# Patient Record
Sex: Male | Born: 1980 | Hispanic: Yes | Marital: Single | Smoking: Never smoker
Health system: Southern US, Community
[De-identification: ages and names within clinical notes are randomized; demographics above are authoritative.]

---

## 2015-03-26 ENCOUNTER — Ambulatory Visit (INDEPENDENT_AMBULATORY_CARE_PROVIDER_SITE_OTHER): Payer: 59 | Admitting: Family Medicine

## 2015-03-26 VITALS — BP 130/80 | HR 62 | Temp 97.4°F | Ht 65.5 in | Wt 173.1 lb

## 2015-03-26 DIAGNOSIS — R079 Chest pain, unspecified: Secondary | ICD-10-CM | POA: Diagnosis not present

## 2015-03-26 DIAGNOSIS — I450 Right fascicular block: Secondary | ICD-10-CM | POA: Diagnosis not present

## 2015-03-26 DIAGNOSIS — H538 Other visual disturbances: Secondary | ICD-10-CM | POA: Diagnosis not present

## 2015-03-26 DIAGNOSIS — R0789 Other chest pain: Secondary | ICD-10-CM | POA: Diagnosis not present

## 2015-03-26 DIAGNOSIS — I4510 Unspecified right bundle-branch block: Secondary | ICD-10-CM

## 2015-03-26 LAB — LIPID PANEL
CHOL/HDL RATIO: 3.9 ratio
Cholesterol: 140 mg/dL (ref 0–200)
HDL: 36 mg/dL — AB (ref >=40)
LDL Cholesterol: 82 mg/dL (ref 0–99)
Triglycerides: 111 mg/dL (ref <150)
VLDL: 22 mg/dL (ref 0–40)

## 2015-03-26 LAB — POCT GLYCOSYLATED HEMOGLOBIN (HGB A1C): HEMOGLOBIN A1C: 5.3

## 2015-03-26 LAB — GLUCOSE, POCT (MANUAL RESULT ENTRY): POC GLUCOSE: 104 mg/dL — AB (ref 70–99)

## 2015-03-26 MED ORDER — DICLOFENAC SODIUM 75 MG PO TBEC: 75.0000 mg | DELAYED_RELEASE_TABLET | Freq: Two times a day (BID) | ORAL | Status: AC

## 2015-03-26 NOTE — Patient Instructions (Addendum)
Take diclofenac one pill twice daily for pain and inflammation in chest  Return at any time if pain continues to persist. However if you ever have sudden severe chest pain go straight to the emergency room or call 911.  Return at anytime if needed.  You did not have diabetes  Go see an eye doctor to get your eyes checked sometime soon

## 2015-03-26 NOTE — Progress Notes (Signed)
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 He has been having chest pains back and forth across his chest the last few days, sometimes right sometimes left. He has had this in the past some but this is been worse. He runs every day. He works Youth workermanual labor. Knows of no specific injury. No nausea or vomiting. No radiation of the pain. No shortness of breath. He does not smoke or drink. He has generally been a healthy man.  Past family social reviewed  Patient does complain of sometimes feeling weak and he wanted some vitamins. We discussed that eating about cystitis was necessary.  He also at the end of the visit complained of some blurring of his vision at times. Will check to make sure he is not diabetic  Objective: Pleasant gentleman alert and oriented in no acute distress. Throat clear. Neck supple without nodes or thyromegaly. Chest is clear to auscultation. Heart regular without murmurs gallops or arrhythmias. Mild tenderness in the left pectoralis region. Abdomen soft without masses or tenderness.  Results for orders placed or performed in visit on 03/26/15  POCT glycosylated hemoglobin (Hb A1C)  Result Value Ref Range   Hemoglobin A1C 5.3   POCT glucose (manual entry)  Result Value Ref Range   POC Glucose 104 (A) 70 - 99 mg/dl     EKG appears normal except for the RSR prime in V1 and V2 consistent with an incomplete right bundle branch block.  Assessment: Chest wall pain Incomplete Right bundle branch block, of no concern  Vision blurring  Plan: Anti-inflammatory medications Return if worse See discharge instructions See eye doctor

## 2015-04-06 ENCOUNTER — Encounter: Payer: Self-pay | Admitting: Family Medicine

## 2016-02-23 ENCOUNTER — Encounter (INDEPENDENT_AMBULATORY_CARE_PROVIDER_SITE_OTHER): Payer: 59 | Admitting: Ophthalmology

## 2016-02-23 DIAGNOSIS — H43813 Vitreous degeneration, bilateral: Secondary | ICD-10-CM

## 2016-02-23 DIAGNOSIS — H35712 Central serous chorioretinopathy, left eye: Secondary | ICD-10-CM

## 2016-04-05 ENCOUNTER — Encounter (INDEPENDENT_AMBULATORY_CARE_PROVIDER_SITE_OTHER): Payer: 59 | Admitting: Ophthalmology

## 2016-04-05 DIAGNOSIS — H43813 Vitreous degeneration, bilateral: Secondary | ICD-10-CM

## 2016-04-05 DIAGNOSIS — H35712 Central serous chorioretinopathy, left eye: Secondary | ICD-10-CM | POA: Diagnosis not present

## 2016-04-05 DIAGNOSIS — H353111 Nonexudative age-related macular degeneration, right eye, early dry stage: Secondary | ICD-10-CM

## 2016-04-05 DIAGNOSIS — H353122 Nonexudative age-related macular degeneration, left eye, intermediate dry stage: Secondary | ICD-10-CM | POA: Diagnosis not present

## 2016-07-06 ENCOUNTER — Encounter (INDEPENDENT_AMBULATORY_CARE_PROVIDER_SITE_OTHER): Payer: 59 | Admitting: Ophthalmology

## 2016-07-06 DIAGNOSIS — H353122 Nonexudative age-related macular degeneration, left eye, intermediate dry stage: Secondary | ICD-10-CM | POA: Diagnosis not present

## 2016-07-06 DIAGNOSIS — H43813 Vitreous degeneration, bilateral: Secondary | ICD-10-CM | POA: Diagnosis not present

## 2016-07-06 DIAGNOSIS — H35712 Central serous chorioretinopathy, left eye: Secondary | ICD-10-CM | POA: Diagnosis not present

## 2016-08-01 ENCOUNTER — Ambulatory Visit (INDEPENDENT_AMBULATORY_CARE_PROVIDER_SITE_OTHER): Payer: 59 | Admitting: Physician Assistant

## 2016-08-01 ENCOUNTER — Ambulatory Visit (HOSPITAL_COMMUNITY)
Admission: RE | Admit: 2016-08-01 | Discharge: 2016-08-01 | Disposition: A | Discharge status: Home / Self Care | Payer: 59 | Source: Ambulatory Visit | Attending: Physician Assistant | Admitting: Physician Assistant

## 2016-08-01 ENCOUNTER — Encounter: Payer: Self-pay | Admitting: Physician Assistant

## 2016-08-01 VITALS — BP 104/66 | HR 64 | Temp 97.3°F | Resp 18 | Ht 65.5 in | Wt 174.0 lb

## 2016-08-01 DIAGNOSIS — J321 Chronic frontal sinusitis: Secondary | ICD-10-CM | POA: Insufficient documentation

## 2016-08-01 DIAGNOSIS — R938 Abnormal findings on diagnostic imaging of other specified body structures: Secondary | ICD-10-CM | POA: Diagnosis not present

## 2016-08-01 DIAGNOSIS — S0993XA Unspecified injury of face, initial encounter: Secondary | ICD-10-CM

## 2016-08-01 DIAGNOSIS — X58XXXA Exposure to other specified factors, initial encounter: Secondary | ICD-10-CM | POA: Insufficient documentation

## 2016-08-01 NOTE — Patient Instructions (Addendum)
  Ice the area three times per day for 15 minutes.  You can take ibuprofen or tylenol for the pain.  Return in 1 week if your pain does not improve.  There were no acute changes on the xray.     IF you received an x-ray today, you will receive an invoice from [facility]. Please contact [facility] at [phone] with questions or concerns regarding your invoice.   IF you received labwork today, you will receive an invoice from [facility]. Please contact [facility] at [phone] with questions or concerns regarding your invoice.   Our billing staff will not be able to assist you with questions regarding bills from these companies.  You will be contacted with the lab results as soon as they are available. The fastest way to get your results is to activate your My Chart account. Instructions are located on the last page of this paperwork. If you have not heard from us regarding the results in 2 weeks, please contact this office.

## 2016-08-01 NOTE — Progress Notes (Addendum)
[facility] [address], [place] [address] [phone]  By signing my name below, I, Mesha Guinyard, attest that this documentation has been prepared under the direction and in the presence of [place]phanie English, PA-C. Electronically Signed: Arvilla MarketMesha Guinyard, Medical Scribe. 08/01/16. 2:27 PM.  Date:  08/01/2016   Name:  Brett Sanders   DOB:  [birth year]   MRN:  [id number]  PCP:  No PCP Per Patient   Chief Complaint  Patient presents with   Facial Injury    PATIENT HIT FACE ON A POLL    History of Present Illness:  Brett Sanders is a 35 y.o. male patient [facility] complaining of painful facial injury onset Sunday (3 days ago) afternoon. Pt was playing soccer when he hit his left medial eyebrow on the goalie poll. Pt reports some bleeding after impact. Pt reports color change around his right eye and states his right eye wasn't effected after the accident but occurred over time. Pt used a ice pack on his eye for 15 mins to help control the swelling and reports some HA. Pt denies LOC, vision loss, nausea, and dizziness.  No exam data present There are no active problems to display for this patient.   History reviewed. No pertinent past medical history.  History reviewed. No pertinent surgical history.  Social History  Substance Use Topics   Smoking status: Never Smoker   Smokeless tobacco: Never Used   Alcohol use No    History reviewed. No pertinent family history.  No Known Allergies  Medication list has been reviewed and updated.  Current Outpatient Prescriptions on File Prior to Visit  Medication Sig Dispense Refill   diclofenac (VOLTAREN) 75 MG EC tablet Take 1 tablet (75 mg total) by mouth 2 (two) times daily. (Patient not taking: Reported on 08/01/2016) 30 tablet 1   No current facility-administered medications on file prior to visit.     Review of Systems  Eyes: Negative for blurred vision.  Gastrointestinal:  Negative for nausea.  Neurological: Positive for headaches. Negative for dizziness and loss of consciousness.   Physical Examination: BP 104/66 (BP Location: Right Arm, Patient Position: Sitting, Cuff Size: Small)    Pulse 64    Temp 97.3 F (36.3 C) (Oral)    Resp 18    Ht 5' 5.5" (1.664 m)    Wt 174 lb (78.9 kg)    SpO2 98%    BMI 28.51 kg/m  Ideal Body Weight: @FLOWAMB ([id number])@([id number])@  Physical Exam  Constitutional: He appears well-developed and well-nourished. No distress.  HENT:  Head: Normocephalic and atraumatic. Head is with right periorbital erythema and with left periorbital erythema.  bilateral periorbital erythema, L>R, with ecchymosis in the medial canthus Left periorbital edema  Eyes: Conjunctivae are normal.  Neck: Neck supple.  Cardiovascular: Normal rate.   Pulmonary/Chest: Effort normal.  Neurological: He is alert.  Skin: Skin is warm and dry.  Psychiatric: He has a normal mood and affect. His behavior is normal.  Nursing note and vitals reviewed.  Assessment and Plan: Brett Sanders is a 35 y.o. male who is here today for cc of facial pain and swelling after injurying with collision of goal post. Obtaining ct, but likely no fracture Advised tylenol   Facial injury, initial encounter - Plan: CT Maxillofacial WO CM, ibuprofen (ADVIL,MOTRIN) 600 MG tablet  Trena PlattStephanie English, PA-C [facility] 08/01/2016 2:27 PM I personally performed the services described  in this documentation, which was scribed in my presence. The recorded information has been reviewed and is accurate.

## 2016-08-02 MED ORDER — IBUPROFEN 600 MG PO TABS: 600.0000 mg | ORAL_TABLET | Freq: Four times a day (QID) | ORAL | 0 refills | Status: AC | PRN

## 2016-10-20 IMAGING — CT CT MAXILLOFACIAL W/O CM
3 series · 16 of 47 positions shown, 19 images · non-contrast
Comparison: None.

CLINICAL DATA: Bilateral orbital soft tissue swelling and pain
worse on the left after correlation with goal post.

EXAM:
CT MAXILLOFACIAL WITHOUT CONTRAST
TECHNIQUE: Multidetector CT imaging of the maxillofacial structures was
performed. Multiplanar CT image reconstructions were also generated.
A small metallic BB was placed on the right temple in order to
reliably differentiate right from left.

[Series 3: maxillofacial 2.0 h32s · axial · 0.33mm/px · z∈[-206,-58]mm · 10 of 86 slices shown, 13 images]
[im 6/86  brain]
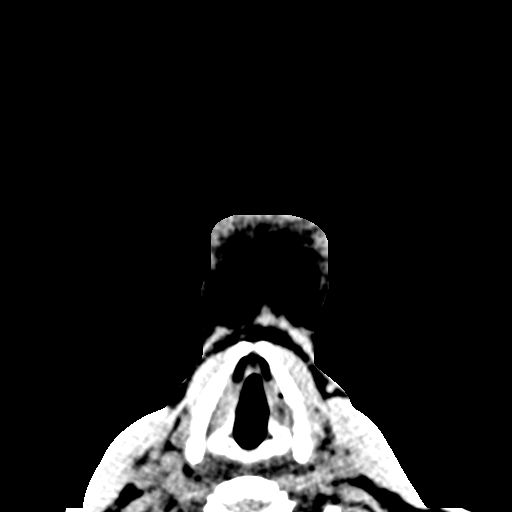
[im 6/86  bone]
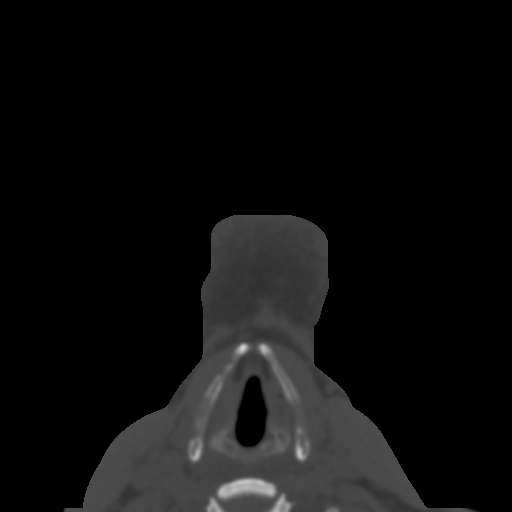
[im 15/86  bone]
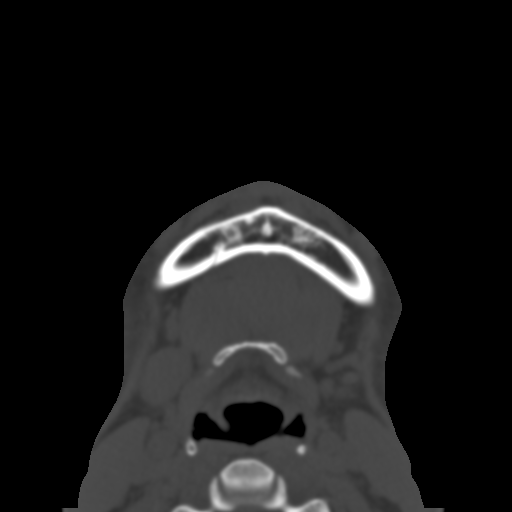
[im 24/86  bone]
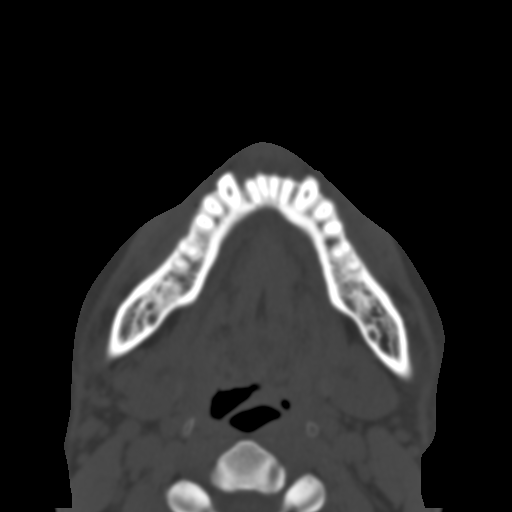
[im 30/86  bone]
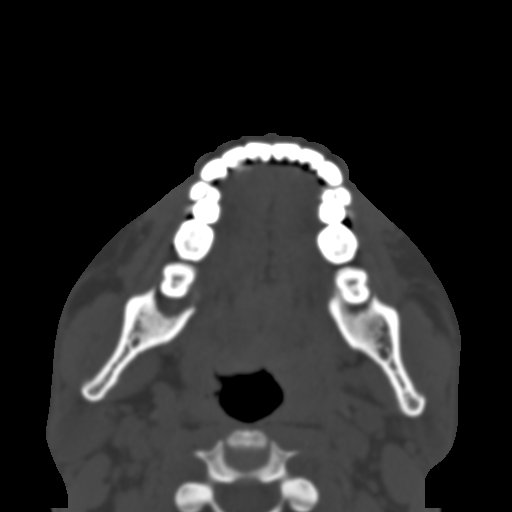
[im 39/86  brain]
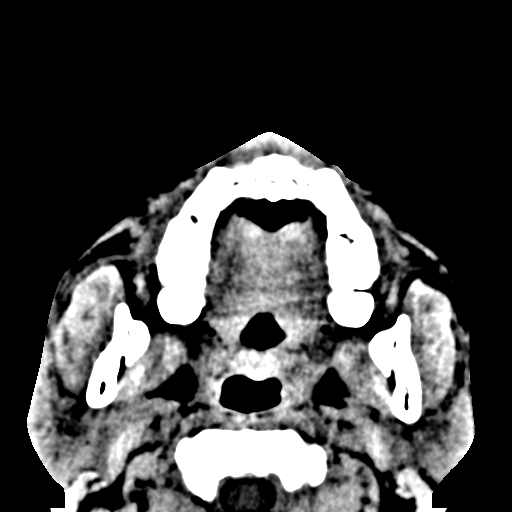
[im 39/86  bone]
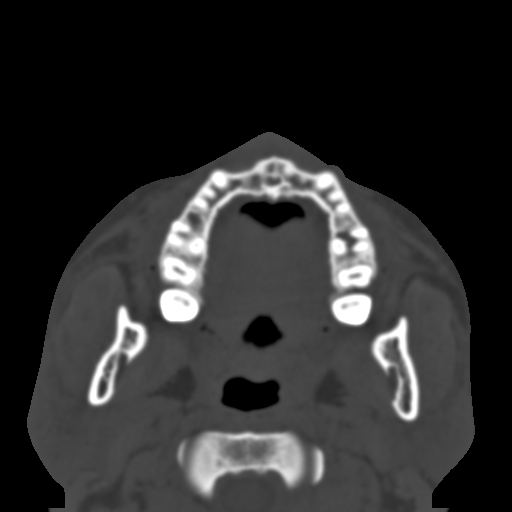
[im 47/86  bone]
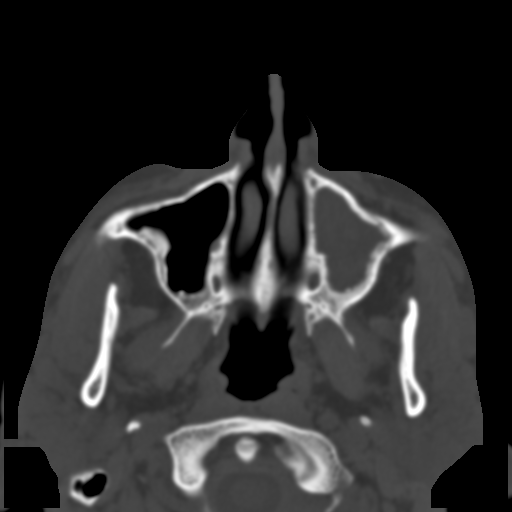
[im 56/86  bone]
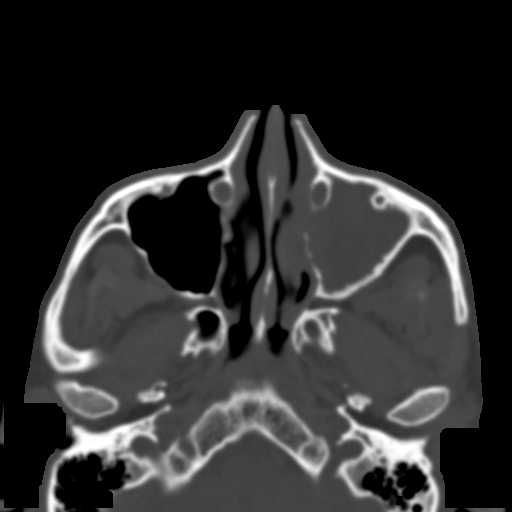
[im 65/86  bone]
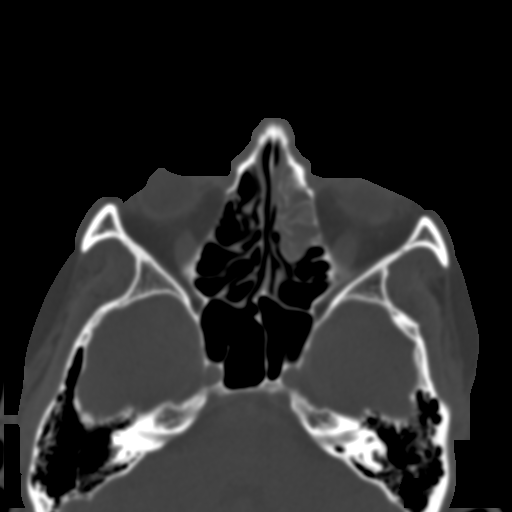
[im 71/86  brain]
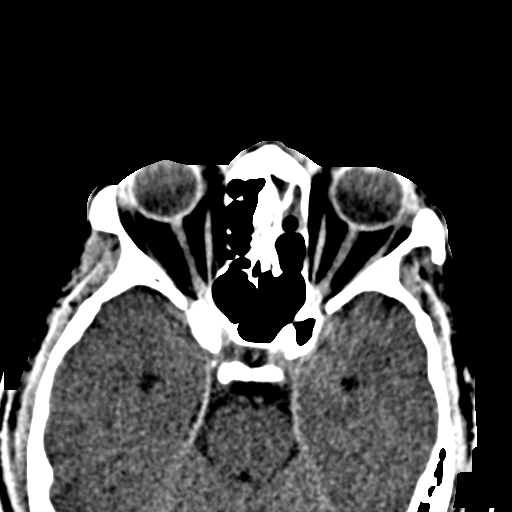
[im 71/86  bone]
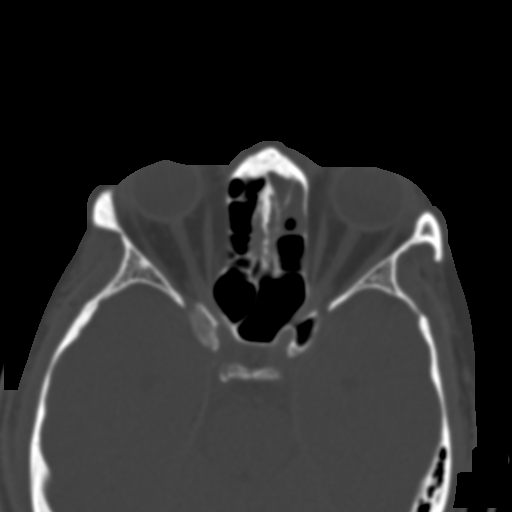
[im 80/86  bone]
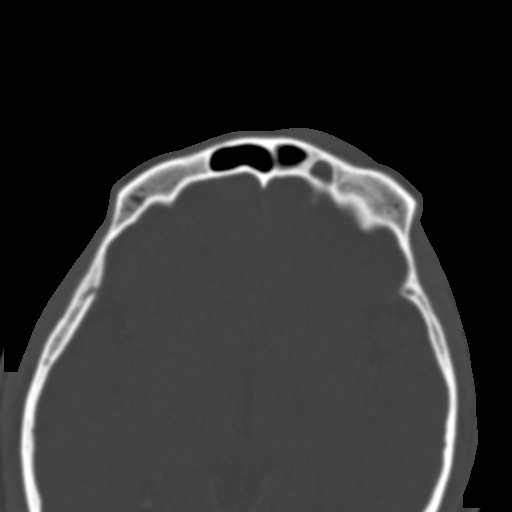

[Series 604: cor s.t. · coronal · 0.34mm/px · 3 of 84 slices shown]
[im 28/84  bone]
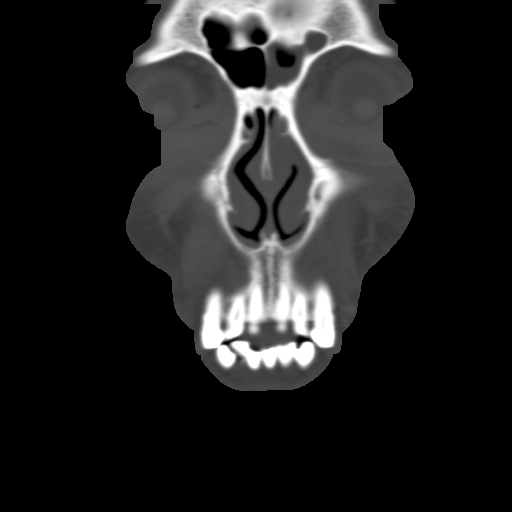
[im 37/84  bone]
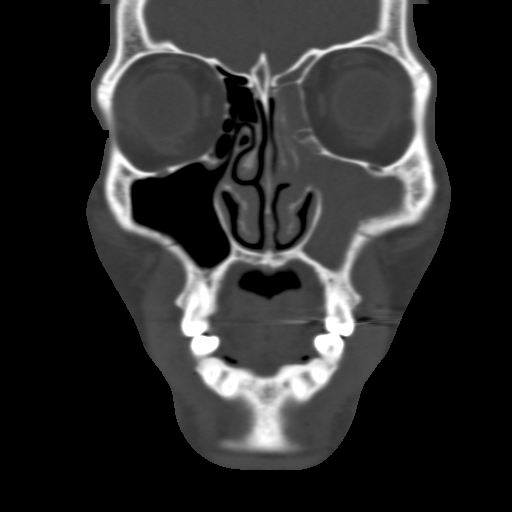
[im 47/84  bone]
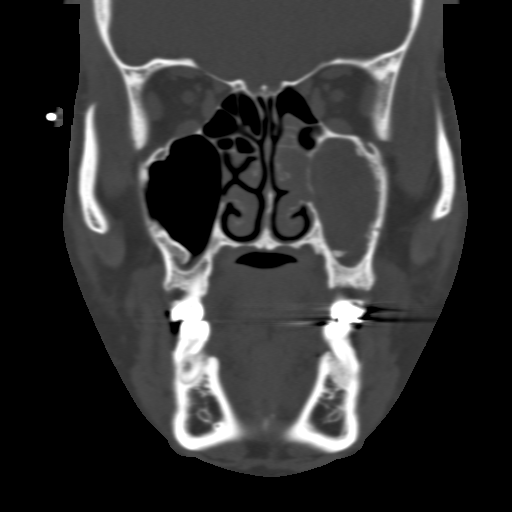

[Series 605: sag s.t. · sagittal · 0.34mm/px · 3 of 115 slices shown]
[im 39/115  bone]
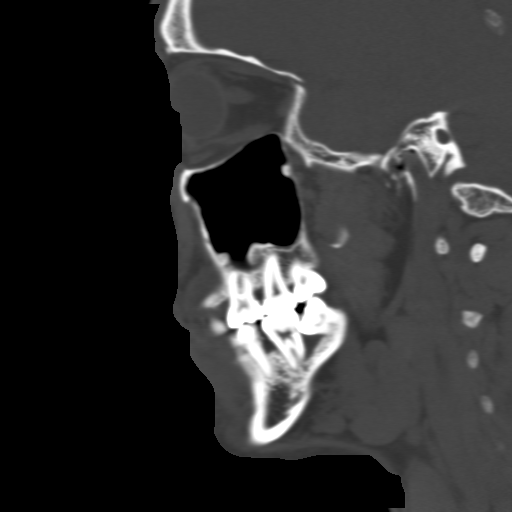
[im 58/115  bone]
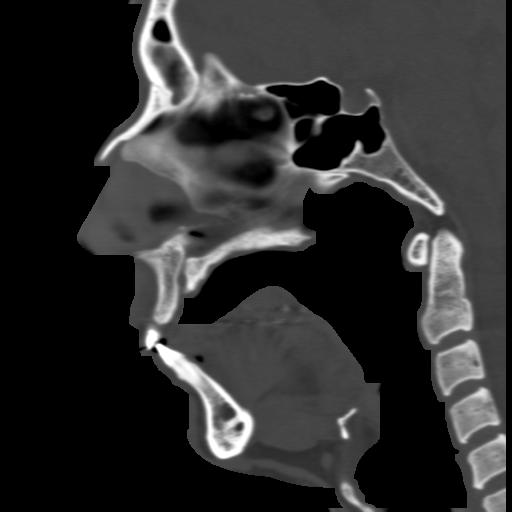
[im 77/115  bone]
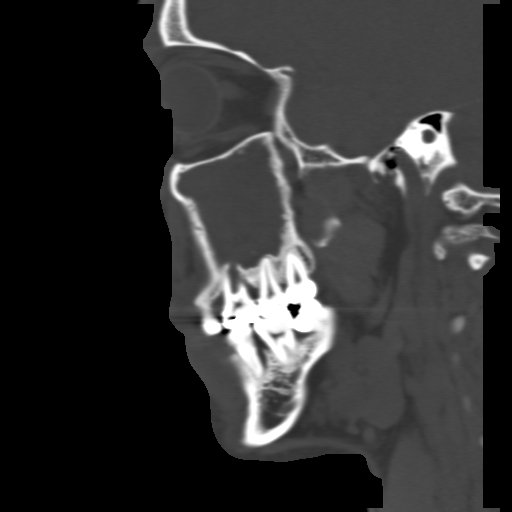

[16 of 47 positions shown; findings below may reference images not displayed]

FINDINGS: Osseous: There is slight thickening of the left maxillary sinus
walls secondary to chronic sinusitis. Remodeling of the medial wall
of the maxillary sinus and adjacent ethmoid sinus. No acute fracture
is identified. No bone destruction is seen.

Orbits: The globes are symmetric in appearance. No detached lenses.
The extraocular muscles and optic nerves are symmetric. No
retrobulbar are soft tissue abnormality. There is left periorbital
soft tissue swelling more so medially and superior to the orbit.
Findings may reflect posttraumatic contusion.

Sinuses: Anterior ethmoid, left maxillary and left frontal sinus
mucosal opacification most severely affecting the left maxillary
sinus is noted consistent with chronic sinusitis in a pattern
possibly representing an obstructing abnormality of the middle
meatus.

Soft tissues: Scattered calcified soft tissue densities are noted of
the face and cheek bilaterally. Forehead and left periorbital
swelling likely related to posttraumatic contusion given patient's
history.

Limited intracranial: No significant or unexpected finding.
IMPRESSION: Soft tissue swelling about the left orbit and forehead consistent
with contusion. Scattered calcifications of soft tissues of face and
forehead of incidental note can be seen with skin disorders such is
acne.

Chronic appearing left-sided maxillary, ethmoid and frontal
sinusitis in a pattern suggesting obstructed middle meatus. Direct
visual correlation for an obstructing source may prove useful such
as a nasal polyp.

## 2016-11-28 ENCOUNTER — Ambulatory Visit (INDEPENDENT_AMBULATORY_CARE_PROVIDER_SITE_OTHER): Payer: 59 | Admitting: Family Medicine

## 2016-11-28 ENCOUNTER — Ambulatory Visit (INDEPENDENT_AMBULATORY_CARE_PROVIDER_SITE_OTHER): Payer: 59

## 2016-11-28 VITALS — BP 136/82 | HR 73 | Temp 97.4°F | Resp 18 | Ht 65.5 in | Wt 172.0 lb

## 2016-11-28 DIAGNOSIS — R1084 Generalized abdominal pain: Secondary | ICD-10-CM

## 2016-11-28 DIAGNOSIS — R079 Chest pain, unspecified: Secondary | ICD-10-CM | POA: Diagnosis not present

## 2016-11-28 DIAGNOSIS — K219 Gastro-esophageal reflux disease without esophagitis: Secondary | ICD-10-CM

## 2016-11-28 DIAGNOSIS — R109 Unspecified abdominal pain: Secondary | ICD-10-CM | POA: Diagnosis not present

## 2016-11-28 LAB — POCT CBC
Granulocyte percent: 81.1 %G — AB (ref 37–80)
HEMATOCRIT: 47.7 % (ref 43.5–53.7)
HEMOGLOBIN: 16.8 g/dL (ref 14.1–18.1)
Lymph, poc: 1.3 (ref 0.6–3.4)
MCH, POC: 29.1 pg (ref 27–31.2)
MCHC: 35.2 g/dL (ref 31.8–35.4)
MCV: 82.5 fL (ref 80–97)
MID (cbc): 0.1 (ref 0–0.9)
MPV: 9.2 fL (ref 0–99.8)
POC GRANULOCYTE: 6.2 (ref 2–6.9)
POC LYMPH PERCENT: 17.1 %L (ref 10–50)
POC MID %: 1.8 %M (ref 0–12)
Platelet Count, POC: 171 10*3/uL (ref 142–424)
RBC: 5.78 M/uL (ref 4.69–6.13)
RDW, POC: 12.9 %
WBC: 7.6 10*3/uL (ref 4.6–10.2)

## 2016-11-28 MED ORDER — TRAMADOL HCL 50 MG PO TABS: 50.0000 mg | ORAL_TABLET | Freq: Three times a day (TID) | ORAL | 0 refills | Status: AC | PRN

## 2016-11-28 MED ORDER — OMEPRAZOLE 40 MG PO CPDR: 40.0000 mg | DELAYED_RELEASE_CAPSULE | Freq: Every day | ORAL | 3 refills | Status: AC

## 2016-11-28 NOTE — Patient Instructions (Addendum)
Your EKG looked very good today.  For the pain in your chest, I think this is from working.  You can take Tramadol for pain relief if you need it.  I think that the pain that you are having after meals is a type of reflux or acid indigestion. Take the omeprazole 1 pill a day for the next month and see if this helps.  Your abdominal x-ray also looked very good. There were no signs of any problems.  Let us know if you're still having trouble despite treatment.     Enfermedad por reflujo gastroesofgico en los adultos (Gastroesophageal Reflux Disease, Adult) Normalmente, los alimentos descienden por el esfago y se depositan en el estmago para su digestin. Si una persona tiene enfermedad por reflujo gastroesofgico (ERGE), los alimentos y el cido estomacal regresan al esfago. Cuando esto ocurre, el esfago se irrita y se hincha (inflama). Con el tiempo, la ERGE puede provocar la formacin de pequeas perforaciones (lceras) en la mucosa del esfago. CUIDADOS EN EL HOGAR Dieta   Siga la dieta como se lo haya indicado el mdico. Tal vez deba evitar los siguientes alimentos y bebidas:  Caf y t (con o sin cafena).  Bebidas que contengan alcohol.  Bebidas energizantes y deportivas.  Gaseosas o refrescos.  Chocolate y cacao.  Menta y esencias de [address]  Ajo y cebollas.  Rbano picante.  Alimentos muy condimentados y cidos, como pimientos, [place] en polvo, curry en polvo, vinagre, salsas picantes y Engineer, water.  Frutas ctricas y sus jugos, como naranjas, limones y limas.  Alimentos a base de tomates, como salsa roja, [place], salsa y pizza con salsa roja.  Alimentos fritos y Lexicographer, como rosquillas, papas fritas y aderezos con alto contenido de Holiday representative.  Carnes con alto contenido de [place], como hot dogs, filetes de entrecot, salchicha, jamn y tocino.  Productos lcteos con alto contenido de [place], como [place], [place] y queso crema.  Consuma pequeas porciones  de comida con ms frecuencia. Evite consumir porciones abundantes.  Evite beber mucho lquido con las comidas.  No coma durante las 2 o 3horas previas a la hora de [place].  No se acueste inmediatamente despus de comer.  No haga actividad fsica enseguida despus de comer. Instrucciones generales   Est atento a cualquier cambio en los sntomas.  Tome los medicamentos de venta libre y los recetados solamente como se lo haya indicado el mdico. No tome aspirina, ibuprofeno ni otros antiinflamatorios no esteroides (AINE), a menos que el mdico lo autorice.  No consuma ningn producto que contenga tabaco, lo que incluye cigarrillos, tabaco de Theatre manager y Administrator, Civil Service. Si necesita ayuda para dejar de fumar, consulte al mdico.  Use ropa suelta. No use nada ajustado alrededor Reynolds [employer].  Levante (eleve) unas 6pulgadas (15centmetros) la cabecera de la cama.  Intente bajar el nivel de estrs. Si necesita ayuda para hacerlo, consulte al [employer].  Si tiene sobrepeso, Media planner un peso saludable. Pregntele a su mdico cmo puede perder peso de manera segura.  Concurra a todas las visitas de control como se lo haya indicado el mdico. Esto es importante. SOLICITE AYUDA SI:  Aparecen nuevos sntomas.  Baja de [place] y no sabe por qu.  Tiene dificultad para tragar o siente dolor al [employer]aur[employer].  Tiene sibilancias o tos que no desaparece.  Los sntomas no mejoran con Scientist, research (medical).  Tiene la voz ronca. SOLICITE AYUDA DE INMEDIATO SI:  Tiene dolor en los brazos, el cuello, los [place], la dentadura  o la espalda.  Berenice Primasranspira, se marea o tiene sensacin de desvanecimiento.  Siente falta de aire o Journalist, newspaperdolor en el pecho.  Vomita y el vmito es parecido a la sangre o a los granos de caf.  Pierde el conocimiento (se desmaya).  Las heces son sanguinolentas o de color negro.  No puede tragar, beber o comer. Esta informacin no tiene Theme park managercomo fin reemplazar el  consejo del mdico. Asegrese de hacerle al mdico cualquier pregunta que tenga. Document Released: 11/17/2010 Document Revised: 07/06/2015 Document Reviewed: 02/09/2015 Elsevier Interactive Patient Education  2017 [employer]   IF you received an x-ray today, you will receive an invoice from [facility]. Please contact [facility] at [phone] with questions or concerns regarding your invoice.   IF you received labwork today, you will receive an invoice from [facility]. Please contact [facility] at [phone] with questions or concerns regarding your invoice.   Our billing staff will not be able to assist you with questions regarding bills from these companies.  You will be contacted with the lab results as soon as they are available. The fastest way to get your results is to activate your My Chart account. Instructions are located on the last page of this paperwork. If you have not heard from us regarding the results in 2 weeks, please contact this office.

## 2016-11-28 NOTE — Progress Notes (Signed)
   Brett Sanders is a 36 y.o. male who [facility] today for chest pain:  1.  Chest pain:  Present for the past 2 weeks. It alternates his left and his right. Describes as a burning sensation in his chest. Not worse with palpation. 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 He is concerned that the pain is from his heart or that he has a hernia.  No dyspnea. He never has dyspnea or chest pain upon actual exertion. He is not taking anything for relief. He is a nonsmoker.  Is no associated diaphoresis. No nausea when he has chest pain, however he often has nausea after meals. No actual vomiting. After meals he also has some left upper quadrant and epigastric burning.  No cough.  No fevers/chills.  No recent travel.  No LE edema.  No melena.  No diarrhea or hematochezia.  He was seen here in 2016 for the same symptoms. He had EKG which was negative that time. He was prescribed diclofenac didn't relieve his symptoms.  ROS as above.    PMH reviewed. Patient is a nonsmoker.   History reviewed. No pertinent past medical history. History reviewed. No pertinent surgical history.  Medications reviewed. Current Outpatient Prescriptions  Medication Sig Dispense Refill  . ibuprofen (ADVIL,MOTRIN) 600 MG tablet Take 1 tablet (600 mg total) by mouth every 6 (six) hours as needed. 30 tablet 0  . diclofenac (VOLTAREN) 75 MG EC tablet Take 1 tablet (75 mg total) by mouth 2 (two) times daily. (Patient not taking: Reported on 08/01/2016) 30 tablet 1   No current facility-administered medications for this visit.      Physical Exam:  BP 136/82   Pulse 73   Temp 97.4 F (36.3 C) (Oral)   Resp 18   Ht 5' 5.5" (1.664 m)   Wt 172 lb (78 kg)   SpO2 99%   BMI 28.19 kg/m  Gen:  Alert, cooperative patient who appears stated age in no acute distress.  Vital signs reviewed. HEENT: EOMI,  MMM Pulm:  Clear to auscultation bilaterally with good air movement.  No wheezes or rales  noted.   Cardiac:  Regular rate and rhythm without murmur auscultated.  Good S1/S2. Chest:  Some chest pain on palpation of the sternal costal borders bilaterally. Abdomen: Soft and nondistended. Tender to palpation directly in the epigastrium. There is moderate tenderness. No guarding or rebound. He has good bowel sounds. Exts: Non edematous BL  LE, warm and well perfused.   Assessment and Plan:  1.  GERD:   - Epigastric pain that is worse after meals. -Plan to treat as GERD with omeprazole. -No red flags. This is been ongoing issue for the patient. - no evidence of constipation by history or abdominal x-rays  #2. Chest pain: -Atypical chest pain.  No evidence of cardiac pain  - EKG here was completely WNL -Seems to be mostly musculoskeletal. -As he has ongoing issues with GERD we'll not treat with NSAIDs, but instead do Tramadol on as needed basis.

## 2016-11-29 ENCOUNTER — Telehealth: Payer: Self-pay | Admitting: Family Medicine

## 2016-11-29 LAB — COMPREHENSIVE METABOLIC PANEL
ALT: 21 IU/L (ref 0–44)
AST: 17 IU/L (ref 0–40)
Albumin/Globulin Ratio: 1.5 (ref 1.2–2.2)
Albumin: 4.9 g/dL (ref 3.5–5.5)
Alkaline Phosphatase: 105 IU/L (ref 39–117)
BUN/Creatinine Ratio: 12 (ref 9–20)
BUN: 11 mg/dL (ref 6–20)
Bilirubin Total: 0.6 mg/dL (ref 0.0–1.2)
CO2: 23 mmol/L (ref 18–29)
Calcium: 10 mg/dL (ref 8.7–10.2)
Chloride: 101 mmol/L (ref 96–106)
Creatinine, Ser: 0.89 mg/dL (ref 0.76–1.27)
GFR calc Af Amer: 126 mL/min/{1.73_m2} (ref >59)
GFR calc non Af Amer: 109 mL/min/{1.73_m2} (ref >59)
Globulin, Total: 3.3 g/dL (ref 1.5–4.5)
Glucose: 98 mg/dL (ref 65–99)
POTASSIUM: 4.5 mmol/L (ref 3.5–5.2)
Sodium: 141 mmol/L (ref 134–144)
TOTAL PROTEIN: 8.2 g/dL (ref 6.0–8.5)

## 2016-11-29 NOTE — Telephone Encounter (Signed)
Called and left voicemail for patient to return call to get results of his labs.  They all look good, which is good news.

## 2016-12-28 ENCOUNTER — Ambulatory Visit (INDEPENDENT_AMBULATORY_CARE_PROVIDER_SITE_OTHER): Payer: 59 | Admitting: Ophthalmology

## 2016-12-28 DIAGNOSIS — H353122 Nonexudative age-related macular degeneration, left eye, intermediate dry stage: Secondary | ICD-10-CM

## 2016-12-28 DIAGNOSIS — H35712 Central serous chorioretinopathy, left eye: Secondary | ICD-10-CM

## 2016-12-28 DIAGNOSIS — H43813 Vitreous degeneration, bilateral: Secondary | ICD-10-CM

## 2017-01-04 ENCOUNTER — Ambulatory Visit (INDEPENDENT_AMBULATORY_CARE_PROVIDER_SITE_OTHER): Payer: 59 | Admitting: Ophthalmology

## 2017-02-16 IMAGING — DX DG ABDOMEN 1V
2 series · 2 of 2 positions shown · non-contrast
Comparison: None.

CLINICAL DATA: Abdominal pain.

EXAM:
ABDOMEN - 1 VIEW

[abdomen kub (1 of 2)]
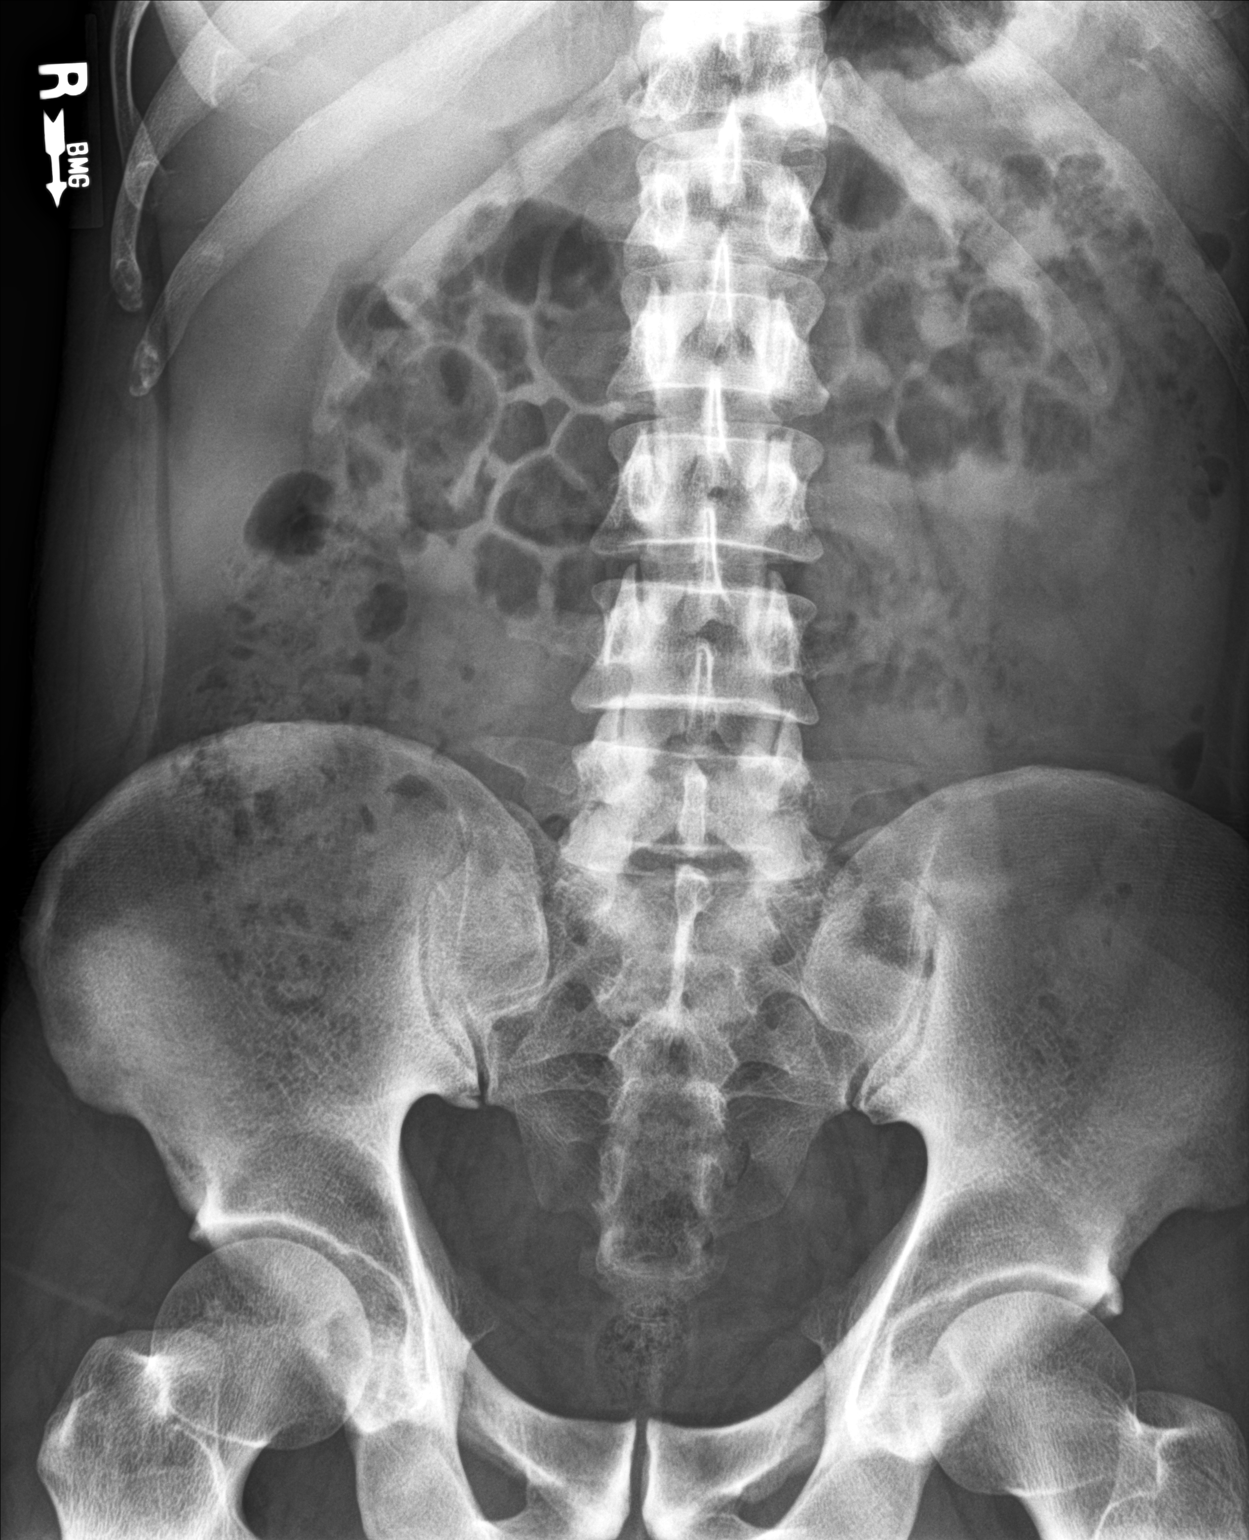

[abdomen kub (2 of 2)]
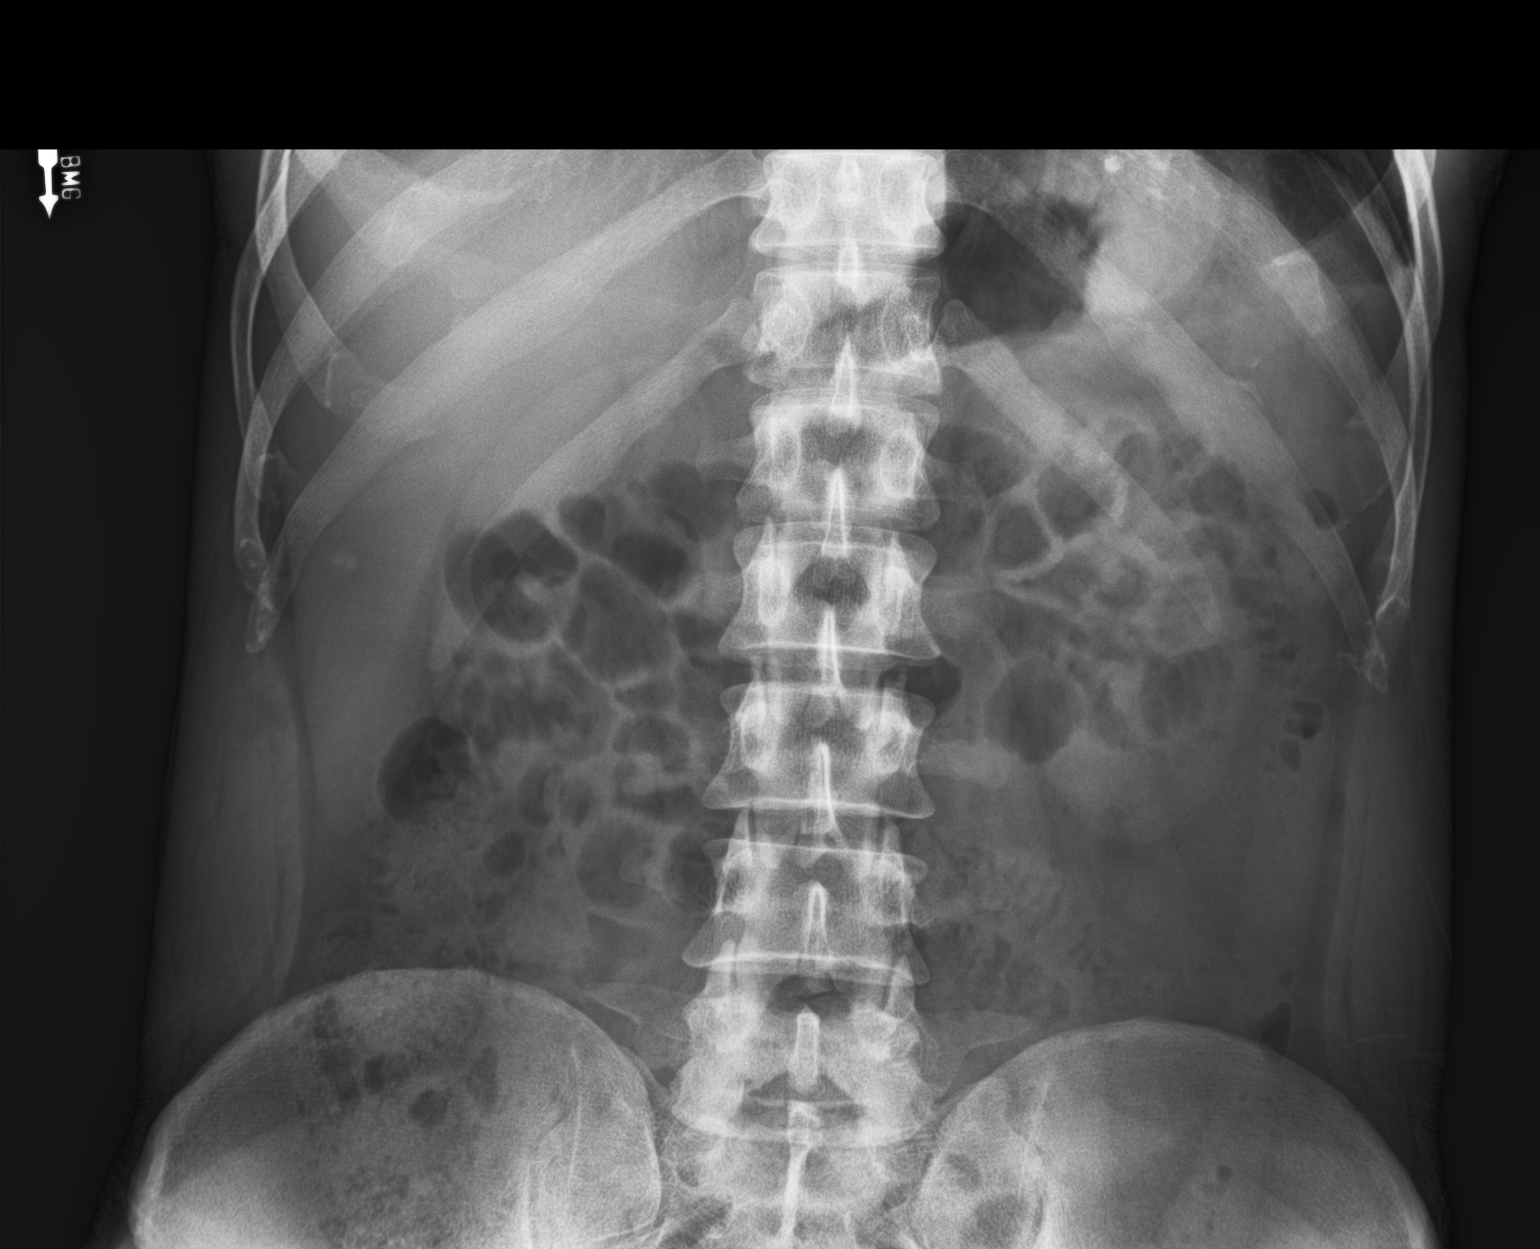

[2 of 2 positions shown; findings below may reference images not displayed]

FINDINGS: The bowel gas pattern is normal. No radio-opaque calculi or other
significant radiographic abnormality are seen.
IMPRESSION: Negative.

## 2017-07-03 ENCOUNTER — Ambulatory Visit (INDEPENDENT_AMBULATORY_CARE_PROVIDER_SITE_OTHER): Payer: 59 | Admitting: Ophthalmology

## 2017-07-03 DIAGNOSIS — H353122 Nonexudative age-related macular degeneration, left eye, intermediate dry stage: Secondary | ICD-10-CM | POA: Diagnosis not present

## 2017-07-03 DIAGNOSIS — H35712 Central serous chorioretinopathy, left eye: Secondary | ICD-10-CM

## 2017-07-03 DIAGNOSIS — H43813 Vitreous degeneration, bilateral: Secondary | ICD-10-CM | POA: Diagnosis not present

## 2018-02-12 ENCOUNTER — Other Ambulatory Visit: Payer: Self-pay

## 2018-02-12 ENCOUNTER — Ambulatory Visit (INDEPENDENT_AMBULATORY_CARE_PROVIDER_SITE_OTHER): Payer: 59 | Admitting: Family Medicine

## 2018-02-12 ENCOUNTER — Encounter: Payer: Self-pay | Admitting: Family Medicine

## 2018-02-12 VITALS — BP 118/72 | HR 73 | Temp 98.0°F | Resp 16 | Ht 65.5 in | Wt 175.6 lb

## 2018-02-12 DIAGNOSIS — H5712 Ocular pain, left eye: Secondary | ICD-10-CM

## 2018-02-12 DIAGNOSIS — S0592XA Unspecified injury of left eye and orbit, initial encounter: Secondary | ICD-10-CM | POA: Diagnosis not present

## 2018-02-12 DIAGNOSIS — H02055 Trichiasis without entropian left lower eyelid: Secondary | ICD-10-CM | POA: Diagnosis not present

## 2018-02-12 DIAGNOSIS — H16142 Punctate keratitis, left eye: Secondary | ICD-10-CM | POA: Diagnosis not present

## 2018-02-12 DIAGNOSIS — T1502XA Foreign body in cornea, left eye, initial encounter: Secondary | ICD-10-CM | POA: Diagnosis not present

## 2018-02-12 NOTE — Patient Instructions (Addendum)
Visit us at:  [facility] [address] [facility] [address] [place] [address] Call us at: [phone]    IF you received an x-ray today, you will receive an invoice from [facility]. Please contact [facility] at [phone] with questions or concerns regarding your invoice.   IF you received labwork today, you will receive an invoice from [facility]. Please contact [facility] at [phone] with questions or concerns regarding your invoice.   Our billing staff will not be able to assist you with questions regarding bills from these companies.  You will be contacted with the lab results as soon as they are available. The fastest way to get your results is to activate your My Chart account. Instructions are located on the last page of this paperwork. If you have not heard from us regarding the results in 2 weeks, please contact this office.

## 2018-02-12 NOTE — Progress Notes (Signed)
Chief Complaint  Patient presents with  . per pt cutting steel pipe yesterday with safety glasses on -    onset: yesterday, went to [facility] and bought soothing eye wash a d systane ultra to help- rinsed eyes t2-3 times with eye wash and put in systane gtts with no relief.   Pt c/o of pain in left eye, pain level 5/10    HPI   Patient reports that he was cutting a steel pipe yesterday and was wearing SAFETY GOGGLES while cutting the pipe. He felt like something got in the eye.  He went to [facility] and got an eye wash and systane and did this 2-3 times in the evening without any relief. He has pain but can see. His pain score is 5/10. He reports that he did not rub the eye and there is no discharge.   4 review of systems  No past medical history on file.  Current Outpatient Medications  Medication Sig Dispense Refill  . diclofenac (VOLTAREN) 75 MG EC tablet Take 1 tablet (75 mg total) by mouth 2 (two) times daily. (Patient not taking: Reported on 08/01/2016) 30 tablet 1  . ibuprofen (ADVIL,MOTRIN) 600 MG tablet Take 1 tablet (600 mg total) by mouth every 6 (six) hours as needed. (Patient not taking: Reported on 02/12/2018) 30 tablet 0  . omeprazole (PRILOSEC) 40 MG capsule Take 1 capsule (40 mg total) by mouth daily. (Patient not taking: Reported on 02/12/2018) 30 capsule 3  . traMADol (ULTRAM) 50 MG tablet Take 1 tablet (50 mg total) by mouth every 8 (eight) hours as needed. (Patient not taking: Reported on 02/12/2018) 30 tablet 0   No current facility-administered medications for this visit.     Allergies: No Known Allergies  No past surgical history on file.  Social History   Socioeconomic History  . Marital status: Single    Spouse name: Not on file  . Number of children: Not on file  . Years of education: Not on file  . Highest education level: Not on file  Occupational History  . Not on file  Social Needs  . Financial resource strain: Not on file  . Food insecurity:   Worry: Not on file    Inability: Not on file  . Transportation needs:    Medical: Not on file    Non-medical: Not on file  Tobacco Use  . Smoking status: Never Smoker  . Smokeless tobacco: Never Used  Substance and Sexual Activity  . Alcohol use: No    Alcohol/week: 0.0 oz  . Drug use: No  . Sexual activity: Not on file  Lifestyle  . Physical activity:    Days per week: Not on file    Minutes per session: Not on file  . Stress: Not on file  Relationships  . Social connections:    Talks on phone: Not on file    Gets together: Not on file    Attends religious service: Not on file    Active member of club or organization: Not on file    Attends meetings of clubs or organizations: Not on file    Relationship status: Not on file  Other Topics Concern  . Not on file  Social History Narrative  . Not on file    No family history on file.   ROS Review of Systems See HPI Constitution: No fevers or chills No malaise No diaphoresis Skin: No rash or itching Eyes: no blurry vision, no double vision GU: no dysuria or  hematuria Neuro: no dizziness or headaches * all others reviewed and negative   Objective: Vitals:   02/12/18 0856  BP: 118/72  Pulse: 73  Resp: 16  Temp: 98 F (36.7 C)  TempSrc: Oral  SpO2: 98%  Weight: 175 lb 9.6 oz (79.7 kg)  Height: 5' 5.5" (1.664 m)    Physical Exam  Constitutional: He appears well-developed and well-nourished.  HENT:  Head: Normocephalic and atraumatic.  Eyes: Pupils are equal, round, and reactive to light. EOM are normal.  Fundoscopic exam:      The right eye shows no AV nicking, no exudate, no hemorrhage and no papilledema.       The left eye shows no AV nicking, no exudate, no hemorrhage and no papilledema.  Slit lamp exam:      The right eye shows foreign body. The right eye shows no corneal flare and no corneal ulcer.       The left eye shows corneal abrasion, foreign body and fluorescein uptake. The left eye shows no  corneal flare and no corneal ulcer.    Pulmonary/Chest: Effort normal.     Eye exam Using fluorescein staining   Assessment and Plan [employer] was seen today for per pt cutting steel pipe yesterday with safety glasses on -.  Diagnoses and all orders for this visit:  Foreign body of left cornea, initial encounter  Left eye pain  Left eye injury, initial encounter   The eye was flushed 3 times then fluorescein stain applied Both eyes were evaluated  Left eye had 2 discrete areas of uptake consistent with foreign body injury. Referred to [facility] for evaluation for foreign body of the eye He was made an appt for 10:30am  Ahyana Skillin A Creta Levin

## 2019-08-16 ENCOUNTER — Emergency Department (HOSPITAL_BASED_OUTPATIENT_CLINIC_OR_DEPARTMENT_OTHER): Admit: 2019-08-16 | Discharge: 2019-08-16 | Discharge status: Absent without leave | Payer: 59

## 2019-08-16 ENCOUNTER — Other Ambulatory Visit: Payer: Self-pay

## 2021-12-21 ENCOUNTER — Emergency Department (HOSPITAL_COMMUNITY)
Admission: EM | Admit: 2021-12-21 | Discharge: 2021-12-21 | Disposition: A | Discharge status: Home / Self Care | Payer: Worker's Compensation | Attending: Emergency Medicine | Admitting: Emergency Medicine

## 2021-12-21 ENCOUNTER — Other Ambulatory Visit: Payer: Self-pay

## 2021-12-21 ENCOUNTER — Encounter (HOSPITAL_COMMUNITY): Payer: Self-pay | Admitting: Emergency Medicine

## 2021-12-21 DIAGNOSIS — W312XXD Contact with powered woodworking and forming machines, subsequent encounter: Secondary | ICD-10-CM | POA: Diagnosis not present

## 2021-12-21 DIAGNOSIS — S6991XD Unspecified injury of right wrist, hand and finger(s), subsequent encounter: Secondary | ICD-10-CM | POA: Diagnosis present

## 2021-12-21 DIAGNOSIS — S61411D Laceration without foreign body of right hand, subsequent encounter: Secondary | ICD-10-CM | POA: Diagnosis not present

## 2021-12-21 DIAGNOSIS — M79641 Pain in right hand: Secondary | ICD-10-CM | POA: Insufficient documentation

## 2021-12-21 NOTE — ED Triage Notes (Signed)
Pt reports cutting arm with saw on Tuesday. Pt reports getting seen for it but wants to get it rechecked.

## 2021-12-21 NOTE — Discharge Instructions (Addendum)
Le he dado el numero del especialista, por favor haga una cita para verlo. El telefono de el esta en sus papeles.  Continue la medicina de antibioticos para prevenir infeccion.

## 2021-12-21 NOTE — ED Provider Notes (Signed)
[facility]-EMERGENCY DEPT Provider Note   CSN: [id number] Arrival date & time: 12/21/21  8466     History  Chief Complaint  Patient presents with   Arm Injury    Brett Sanders is a 41 y.o. male.  41 y.o male with no PMH presents to the ED with a chief complaint of right pain pain s/p injury x 3 days. Patient did receive care at [facility], he was referred to outpatient hand specialist.  He reports trying to make an appointment with them but was unable to get in for about a week.  He was told by his company that he needed to return to the emergency department to be further evaluated.  He has been taking Norco for pain control, Keflex prophylactically to prevent any infection.  He is reporting no worsening pain.  No fever, no symptoms.  The history is provided by the patient and medical records.  Arm Injury Location:  Hand Hand location:  [place] of R hand Injury: yes   Time since incident:  3 hours Associated symptoms: no fever       Home Medications Prior to Admission medications   Medication Sig Start Date End Date Taking? Authorizing Provider  diclofenac (VOLTAREN) 75 MG EC tablet Take 1 tablet (75 mg total) by mouth 2 (two) times daily. Patient not taking: Reported on 08/01/2016 03/26/15   Peyton Najjar, MD  ibuprofen (ADVIL,MOTRIN) 600 MG tablet Take 1 tablet (600 mg total) by mouth every 6 (six) hours as needed. Patient not taking: Reported on 02/12/2018 08/02/16   Trena Platt D, PA  omeprazole (PRILOSEC) 40 MG capsule Take 1 capsule (40 mg total) by mouth daily. Patient not taking: Reported on 02/12/2018 11/28/16   Tobey Grim, MD  traMADol (ULTRAM) 50 MG tablet Take 1 tablet (50 mg total) by mouth every 8 (eight) hours as needed. Patient not taking: Reported on 02/12/2018 11/28/16   Tobey Grim, MD      Allergies    Patient has no known allergies.    Review of Systems   Review of Systems  Constitutional:  Negative for fever.   Musculoskeletal:  Negative for myalgias.  Skin:  Positive for wound.   Physical Exam Updated Vital Signs BP 122/86 (BP Location: Left Arm)    Pulse 85    Temp 98.4 F (36.9 C) (Oral)    Resp 20    SpO2 98%  Physical Exam Vitals and nursing note reviewed.  Constitutional:      Appearance: Normal appearance.  HENT:     Head: Normocephalic and atraumatic.     Mouth/Throat:     Mouth: Mucous membranes are moist.  Cardiovascular:     Rate and Rhythm: Normal rate.     Pulses:          Radial pulses are 2+ on the right side.  Pulmonary:     Effort: Pulmonary effort is normal.  Abdominal:     General: Abdomen is flat.     Tenderness: There is no abdominal tenderness.  Musculoskeletal:     Right hand: Laceration present. No tenderness or bony tenderness. Normal strength. There is no disruption of two-point discrimination. Normal capillary refill. Normal pulse.     Cervical back: Normal range of motion and neck supple.  Skin:    General: Skin is warm and dry.     Findings: Erythema present.  Neurological:     Mental Status: He is alert and oriented to person, place,  and time.         ED Results / Procedures / Treatments   Labs (all labs ordered are listed, but only abnormal results are displayed) Labs Reviewed - No data to display  EKG None  Radiology No results found.  Procedures Procedures    Medications Ordered in ED Medications - No data to display  ED Course/ Medical Decision Making/ A&P                           Medical Decision Making   Patient with right hand injury that occurred 3 days ago, the injury primarily closed in [place] at [facility].  He is reporting needing follow-up with hand specialist.  On today's arrival wound appears dry, I personally remove dressing no surrounding erythema he is able to flex and extend his tire hand.  No purulent discharge noted.  Pulses are present.  Call placed to [facility] orthopedic PA who recommended  outpatient follow-up.  Patient be provided with the phone number to Dr. Merlyn Lot, provided with splint again and stable for discharge.    Portions of this note were generated with Scientist, clinical (histocompatibility and immunogenetics). Dictation errors may occur despite best attempts at proofreading.   Final Clinical Impression(s) / ED Diagnoses Final diagnoses:  Hand injury, right, subsequent encounter    Rx / DC Orders ED Discharge Orders     None         Claude Manges, PA-C 12/21/21 1001    Mancel Bale, MD 12/21/21 1511

## 2022-12-19 ENCOUNTER — Emergency Department (HOSPITAL_COMMUNITY): Payer: No Typology Code available for payment source

## 2022-12-19 ENCOUNTER — Encounter (HOSPITAL_COMMUNITY): Payer: Self-pay

## 2022-12-19 ENCOUNTER — Other Ambulatory Visit: Payer: Self-pay

## 2022-12-19 ENCOUNTER — Emergency Department (HOSPITAL_COMMUNITY)
Admission: EM | Admit: 2022-12-19 | Discharge: 2022-12-19 | Disposition: A | Discharge status: Home / Self Care | Payer: No Typology Code available for payment source | Attending: Emergency Medicine | Admitting: Emergency Medicine

## 2022-12-19 DIAGNOSIS — E876 Hypokalemia: Secondary | ICD-10-CM | POA: Diagnosis not present

## 2022-12-19 DIAGNOSIS — R404 Transient alteration of awareness: Secondary | ICD-10-CM | POA: Diagnosis not present

## 2022-12-19 DIAGNOSIS — Z20822 Contact with and (suspected) exposure to covid-19: Secondary | ICD-10-CM | POA: Diagnosis not present

## 2022-12-19 DIAGNOSIS — R0789 Other chest pain: Secondary | ICD-10-CM | POA: Insufficient documentation

## 2022-12-19 DIAGNOSIS — E871 Hypo-osmolality and hyponatremia: Secondary | ICD-10-CM | POA: Insufficient documentation

## 2022-12-19 DIAGNOSIS — R739 Hyperglycemia, unspecified: Secondary | ICD-10-CM | POA: Insufficient documentation

## 2022-12-19 DIAGNOSIS — R4182 Altered mental status, unspecified: Secondary | ICD-10-CM | POA: Diagnosis present

## 2022-12-19 DIAGNOSIS — Y900 Blood alcohol level of less than 20 mg/100 ml: Secondary | ICD-10-CM | POA: Diagnosis not present

## 2022-12-19 LAB — COMPREHENSIVE METABOLIC PANEL
ALT: 28 U/L (ref 0–44)
AST: 31 U/L (ref 15–41)
Albumin: 3.9 g/dL (ref 3.5–5.0)
Alkaline Phosphatase: 84 U/L (ref 38–126)
Anion gap: 12 (ref 5–15)
BUN: 13 mg/dL (ref 6–20)
CO2: 23 mmol/L (ref 22–32)
Calcium: 8.9 mg/dL (ref 8.9–10.3)
Chloride: 99 mmol/L (ref 98–111)
Creatinine, Ser: 0.98 mg/dL (ref 0.61–1.24)
GFR, Estimated: >60 mL/min (ref >60)
Glucose, Bld: 127 mg/dL — ABNORMAL HIGH (ref 70–99)
Potassium: 3.2 mmol/L — ABNORMAL LOW (ref 3.5–5.1)
Sodium: 134 mmol/L — ABNORMAL LOW (ref 135–145)
Total Bilirubin: 0.7 mg/dL (ref 0.3–1.2)
Total Protein: 7.4 g/dL (ref 6.5–8.1)

## 2022-12-19 LAB — RAPID URINE DRUG SCREEN, HOSP PERFORMED
Amphetamines: NOT DETECTED
Barbiturates: NOT DETECTED
Benzodiazepines: NOT DETECTED
Cocaine: NOT DETECTED
Opiates: NOT DETECTED
Tetrahydrocannabinol: NOT DETECTED

## 2022-12-19 LAB — CBC
HCT: 47 % (ref 39.0–52.0)
Hemoglobin: 15.4 g/dL (ref 13.0–17.0)
MCH: 27.2 pg (ref 26.0–34.0)
MCHC: 32.8 g/dL (ref 30.0–36.0)
MCV: 83 fL (ref 80.0–100.0)
Platelets: 242 10*3/uL (ref 150–400)
RBC: 5.66 MIL/uL (ref 4.22–5.81)
RDW: 13 % (ref 11.5–15.5)
WBC: 8.5 10*3/uL (ref 4.0–10.5)
nRBC: 0 % (ref 0.0–0.2)

## 2022-12-19 LAB — ETHANOL: Alcohol, Ethyl (B): <10 mg/dL (ref <10)

## 2022-12-19 LAB — URINALYSIS, ROUTINE W REFLEX MICROSCOPIC
Bacteria, UA: NONE SEEN
Bilirubin Urine: NEGATIVE
Glucose, UA: NEGATIVE mg/dL
Ketones, ur: NEGATIVE mg/dL
Leukocytes,Ua: NEGATIVE
Nitrite: NEGATIVE
Protein, ur: NEGATIVE mg/dL
Specific Gravity, Urine: 1.009 (ref 1.005–1.030)
pH: 6 (ref 5.0–8.0)

## 2022-12-19 LAB — RESP PANEL BY RT-PCR (RSV, FLU A&B, COVID)  RVPGX2
Influenza A by PCR: NEGATIVE
Influenza B by PCR: NEGATIVE
Resp Syncytial Virus by PCR: NEGATIVE
SARS Coronavirus 2 by RT PCR: NEGATIVE

## 2022-12-19 LAB — TROPONIN I (HIGH SENSITIVITY)
Troponin I (High Sensitivity): 5 ng/L (ref <18)
Troponin I (High Sensitivity): 5 ng/L (ref <18)

## 2022-12-19 LAB — PROTIME-INR
INR: 1.1 (ref 0.8–1.2)
Prothrombin Time: 14.5 seconds (ref 11.4–15.2)

## 2022-12-19 LAB — CBG MONITORING, ED: Glucose-Capillary: 127 mg/dL — ABNORMAL HIGH (ref 70–99)

## 2022-12-19 MED ORDER — IOHEXOL 350 MG/ML SOLN
75.0000 mL | Freq: Once | INTRAVENOUS | Status: AC | PRN
  Administered 2022-12-19: 75 mL via INTRAVENOUS

## 2022-12-19 MED ORDER — LIDOCAINE VISCOUS HCL 2 % MT SOLN
15.0000 mL | Freq: Once | OROMUCOSAL | Status: AC
  Administered 2022-12-19: 15 mL via ORAL
  Filled 2022-12-19: qty 15

## 2022-12-19 MED ORDER — ALUM & MAG HYDROXIDE-SIMETH 200-200-20 MG/5ML PO SUSP
30.0000 mL | Freq: Once | ORAL | Status: AC
  Administered 2022-12-19: 30 mL via ORAL
  Filled 2022-12-19: qty 30

## 2022-12-19 NOTE — ED Triage Notes (Signed)
Pt arrived to triage complaining of feeling "sick". Pt states that he thinks he was sleepwalking and hasn't fully woken up yet.   Pt brother at bedside state that pt asked him to bring pt to ED because he felt cold and confused.   Pt denies pain at this time and denies hx of similar events  Pt is Alert and oriented x4 Moves all extremities with equal strength   Video interpreter used for interview

## 2022-12-19 NOTE — Discharge Instructions (Addendum)
During the workup we noted incidental findings on your imaging that would require you to follow-up with your regular doctor for further evaluation/management: Mildly spiculated 12 mm right upper lobe lung nodule detected on  incomplete Chest CT. Per Fleischner Society Guidelines, recommend  prompt non-contrast Chest CT for further evaluation    Durante el estudio, notamos hallazgos incidentales en sus imgenes que requeriran que usted hiciera un seguimiento con su mdico habitual para una evaluacin/manejo adicional:  Ndulo pulmonar ligeramente espiculado de 12 mm en el lbulo superior derecho detectado en TC de trax incompleta. Segn las directrices de Conservation officer, nature, recomendar Tomografa computarizada de trax sin contraste para una evaluacin adicional

## 2022-12-19 NOTE — ED Provider Notes (Signed)
[facility] Provider Note  CSN: [id number] Arrival date & time: 12/19/22 [id number]  Chief Complaint(s) Altered Mental Status  HPI Brett Sanders is a 42 y.o. male with no pertinent past medical history who presents to the emergency department for altered state.  Patient reports feels "off" as if he is sleepwalking.  Patient reports that this occurred after waking up at 10 PM yesterday.  States that he had a slight headache and chest discomfort at that time.  He denies any changes in sensation or focal weakness.  Reports that he recently had allergy/URI symptoms for which she was taken Alka-Seltzer.  He reports last taking this 3 days ago.  He denies any noted fevers.  Headache is general and mild.  No alleviating or aggravating factors. Chest discomfort is intermittent and substernal.  Nonradiating and nonexertional.  No associated shortness of breath.  No nausea or vomiting.  No abdominal pain.  No other physical complaints.  Patient denies any distinct drug use or recent alcohol use.   Altered Mental Status   Past Medical History History reviewed. No pertinent past medical history. There are no problems to display for this patient.  Home Medication(s) Prior to Admission medications   Medication Sig Start Date End Date Taking? Authorizing Provider  diclofenac (VOLTAREN) 75 MG EC tablet Take 1 tablet (75 mg total) by mouth 2 (two) times daily. Patient not taking: Reported on 08/01/2016 03/26/15   Posey Boyer, MD  ibuprofen (ADVIL,MOTRIN) 600 MG tablet Take 1 tablet (600 mg total) by mouth every 6 (six) hours as needed. Patient not taking: Reported on 02/12/2018 08/02/16   Ivar Drape D, PA  omeprazole (PRILOSEC) 40 MG capsule Take 1 capsule (40 mg total) by mouth daily. Patient not taking: Reported on 02/12/2018 11/28/16   Alveda Reasons, MD  traMADol (ULTRAM) 50 MG tablet Take 1 tablet (50 mg total) by mouth every 8 (eight) hours as  needed. Patient not taking: Reported on 02/12/2018 11/28/16   Alveda Reasons, MD                                                                                                                                    Allergies Patient has no known allergies.  Review of Systems Review of Systems As noted in HPI  Physical Exam Vital Signs  I have reviewed the triage vital signs BP (!) 113/58 (BP Location: Left Arm)   Pulse 74   Temp 97.6 F (36.4 C) (Oral)   Resp 16   Ht 5' 5"$  (1.651 m)   Wt 79.4 kg   SpO2 98%   BMI 29.12 kg/m   Physical Exam Vitals reviewed.  Constitutional:      General: He is not in acute distress.    Appearance: He is well-developed. He is not diaphoretic.  HENT:     Head: Normocephalic and atraumatic.  Nose: Nose normal.  Eyes:     General: No scleral icterus.       Right eye: No discharge.        Left eye: No discharge.     Conjunctiva/sclera: Conjunctivae normal.     Pupils: Pupils are equal, round, and reactive to light.  Cardiovascular:     Rate and Rhythm: Normal rate and regular rhythm.     Heart sounds: No murmur heard.    No friction rub. No gallop.  Pulmonary:     Effort: Pulmonary effort is normal. No respiratory distress.     Breath sounds: Normal breath sounds. No stridor. No rales.  Abdominal:     General: There is no distension.     Palpations: Abdomen is soft.     Tenderness: There is no abdominal tenderness.  Musculoskeletal:        General: No tenderness.     Cervical back: Normal range of motion and neck supple.  Skin:    General: Skin is warm and dry.     Findings: No erythema or rash.  Neurological:     Mental Status: He is alert and oriented to person, place, and time.     Comments: Mental Status:  Alert and oriented to person, place, and time.  Attention and concentration normal.  Speech clear.  Recent memory is intact  Cranial Nerves:  II Visual Fields: Intact to confrontation. Visual fields intact. III, IV,  VI: Pupils equal and reactive to light and near. Full eye movement without nystagmus  V Facial Sensation: Normal. No weakness of masticatory muscles  VII: No facial weakness or asymmetry  VIII Auditory Acuity: Grossly normal  IX/X: The uvula is midline; the palate elevates symmetrically  XI: Normal sternocleidomastoid and trapezius strength  XII: The tongue is midline. No atrophy or fasciculations.   Motor System: Muscle Strength: 5/5 and symmetric in the upper and lower extremities. No pronation or drift.  Muscle Tone: Tone and muscle bulk are normal in the upper and lower extremities.  Coordination: Intact finger-to-nose, heel-to-shin. No tremor.  Sensation: Intact to light touch, and pinprick.  Gait: Routine gait normal.      ED Results and Treatments Labs (all labs ordered are listed, but only abnormal results are displayed) Labs Reviewed  COMPREHENSIVE METABOLIC PANEL - Abnormal; Notable for the following components:      Result Value   Sodium 134 (*)    Potassium 3.2 (*)    Glucose, Bld 127 (*)    All other components within normal limits  URINALYSIS, ROUTINE W REFLEX MICROSCOPIC - Abnormal; Notable for the following components:   Color, Urine STRAW (*)    Hgb urine dipstick MODERATE (*)    All other components within normal limits  CBG MONITORING, ED - Abnormal; Notable for the following components:   Glucose-Capillary 127 (*)    All other components within normal limits  RESP PANEL BY RT-PCR (RSV, FLU A&B, COVID)  RVPGX2  CBC  PROTIME-INR  RAPID URINE DRUG SCREEN, HOSP PERFORMED  ETHANOL  I-STAT CHEM 8, ED  TROPONIN I (HIGH SENSITIVITY)  TROPONIN I (HIGH SENSITIVITY)  EKG  EKG Interpretation  Date/Time:  Wednesday December 19 2022 03:37:22 EST Ventricular Rate:  85 PR Interval:  140 QRS Duration: 104 QT Interval:  384 QTC Calculation: 456 R  Axis:   99 Text Interpretation: Normal sinus rhythm Rightward axis Incomplete right bundle branch block Borderline ECG No previous ECGs available Confirmed by Addison Lank [phone]) on 12/19/2022 5:59:20 AM       Radiology DG Chest 2 View  Result Date: 12/19/2022 CLINICAL DATA:  42 year old male with chest discomfort. EXAM: CHEST - 2 VIEW COMPARISON:  CTA neck 0455 hours today. FINDINGS: Lung volumes and mediastinal contours are within normal limits. Visualized tracheal air column is within normal limits. No pneumothorax, pulmonary edema, pleural effusion or consolidation. 12 mm right upper lung nodule faintly visible by x-ray between the 1st and 2nd ribs. No other confluent lung opacity. No acute osseous abnormality identified. Negative visible bowel gas. IMPRESSION: 1. The 12 mm right upper lung nodule on CTA is re-demonstrated (please see that report). 2. Otherwise negative radiographic appearance of the chest. Electronically Signed   By: Genevie Ann M.D.   On: 12/19/2022 06:29   CT ANGIO HEAD NECK W WO CM  Result Date: 12/19/2022 CLINICAL DATA:  42 year old male with altered mental status. Headache. Dizziness. EXAM: CT ANGIOGRAPHY HEAD AND NECK TECHNIQUE: Multidetector CT imaging of the head and neck was performed using the standard protocol during bolus administration of intravenous contrast. Multiplanar CT image reconstructions and MIPs were obtained to evaluate the vascular anatomy. Carotid stenosis measurements (when applicable) are obtained utilizing NASCET criteria, using the distal internal carotid diameter as the denominator. RADIATION DOSE REDUCTION: This exam was performed according to the departmental dose-optimization program which includes automated exposure control, adjustment of the mA and/or kV according to patient size and/or use of iterative reconstruction technique. CONTRAST:  81m OMNIPAQUE IOHEXOL 350 MG/ML SOLN COMPARISON:  Face CT 08/01/2016. FINDINGS: CT HEAD Brain: Normal  cerebral volume. No midline shift, ventriculomegaly, mass effect, evidence of mass lesion, intracranial hemorrhage or evidence of cortically based acute infarction. Gray-white matter differentiation is within normal limits throughout the brain. Calvarium and skull base: No acute osseous abnormality identified. Paranasal sinuses: Left maxillary sinus mucoperiosteal thickening is chronic but aeration of that sinus has improved compared to 2017. Left frontal and ethmoid sinus aeration also improved. No sinus fluid levels. Tympanic cavities and mastoids are clear. Orbits: No acute orbit or scalp soft tissue finding. CTA NECK Skeleton: Intermittent bilateral periapical dental lucency. No acute osseous abnormality identified. Upper chest: Mildly spiculated 12 mm right upper lobe lung nodule on series 8, image 160 and series 12, image 156 with adjacent mild apical lung scarring. Negative visible left upper lung and mediastinum. Other neck: No acute finding. Aortic arch: 3 vessel arch configuration with no arch atherosclerosis. Right carotid system: Negative. Left carotid system: Negative. Vertebral arteries: Negative. Codominant with normal proximal subclavian arteries and vertebral origins. CTA HEAD Posterior circulation: Normal distal vertebral arteries and vertebrobasilar junction. Normal PICA origins. Patent basilar artery without stenosis. Normal SCA and PCA origins. Posterior communicating arteries are diminutive or absent. Bilateral PCA branches are within normal limits. Anterior circulation: Both ICA siphons are patent. No siphon plaque or stenosis. Patent carotid termini, MCA and ACA origins. Diminutive or absent anterior communicating artery. Bilateral ACA branches are within normal limits. Left MCA M1 segment and trifurcation are patent without stenosis. Right MCA M1 and bifurcation are patent without stenosis. Bilateral MCA branches are within normal limits. Venous sinuses: Patent. Anatomic variants: None.  Review of the MIP images confirms the above findings IMPRESSION: 1. Normal CTA Head and Neck.  Normal CT appearance of the brain. 2. Mildly spiculated 12 mm right upper lobe lung nodule detected on incomplete Chest CT. Per Fleischner Society Guidelines, recommend prompt non-contrast Chest CT for further evaluation. Reference: Radiology. 2017; 284(1):228-43. 3. Chronic paranasal sinus disease, improved since 2017. Electronically Signed   By: Genevie Ann M.D.   On: 12/19/2022 05:18    Medications Ordered in ED Medications  iohexol (OMNIPAQUE) 350 MG/ML injection 75 mL (75 mLs Intravenous Contrast Given 12/19/22 0502)  alum & mag hydroxide-simeth (MAALOX/MYLANTA) 200-200-20 MG/5ML suspension 30 mL (30 mLs Oral Given 12/19/22 0610)    And  lidocaine (XYLOCAINE) 2 % viscous mouth solution 15 mL (15 mLs Oral Given 12/19/22 0610)                                                                                                                                     Procedures Procedures  (including critical care time)  Medical Decision Making / ED Course   Medical Decision Making Amount and/or Complexity of Data Reviewed Labs: ordered. Decision-making details documented in ED Course. Radiology: ordered and independent interpretation performed. Decision-making details documented in ED Course. ECG/medicine tests: ordered and independent interpretation performed. Decision-making details documented in ED Course.  Risk OTC drugs. Prescription drug management.    This patient presents to the ED for concern of altered state with headache and intermittent chest pain, this involves an extensive number of treatment options, and is a complaint that carries with it a high risk of complications and morbidity. The differential diagnosis includes but not limited to electrolyte or metabolic derangements, will assess for any intracranial findings that would lead to or be concerning for CVA, atypical ACS, infectious process,  medication side effect.  EKG without acute ischemic changes or evidence of pericarditis.  Heart score less than 3.  Serial troponins negative x 2 ruling out ACS.  CBC without leukocytosis or anemia Metabolic panel with mild hyponatremia and hypokalemia.  Mild hyperglycemia without evidence of DKA.  No renal insufficiency. UA without evidence of infection UDS negative EtOH negative Viral panel negative for COVID, influenza, RSV  CTA head neck negative for any vascular occlusion or direct dissection.  No cervical changes concerning for prior CVA.  Incidentally there was a 12 mm nodule in the right upper lobe.  Patient made aware of this. Chest x-ray without evidence of pneumonia, pneumothorax, pulmonary edema or pleural effusions.       Final Clinical Impression(s) / ED Diagnoses Final diagnoses:  Chest discomfort  Transient alteration of awareness   The patient appears reasonably screened and/or stabilized for discharge and I doubt any other medical condition or other [facility] requiring further screening, evaluation, or treatment in the ED at this time. I have discussed the findings, Dx and Tx plan with the patient/family who expressed understanding and agree(s) with  the plan. Discharge instructions discussed at length. The patient/family was given strict return precautions who verbalized understanding of the instructions. No further questions at time of discharge.  Disposition: Discharge  Condition: Good  ED Discharge Orders     None        Follow Up: Primary care provider  Schedule an appointment as soon as possible for a visit  if you do not have a primary care physician, contact [facility] at [phone] for referral           This chart was dictated using voice recognition software.  Despite best efforts to proofread,  errors can occur which can change the documentation meaning.    Fatima Blank, MD 12/19/22 1750

## 2022-12-19 NOTE — ED Provider Triage Note (Signed)
Emergency Medicine Provider Triage Evaluation Note  Brett Sanders , a 42 y.o. male  was evaluated in triage.  Pt complains of feeling as if he is sleepwalking.  Patient states that he went to sleep around 10 PM yesterday, woke up and felt as if he was sleepwalking, states that he has a slight headache, states that he just feels abnormal, he states he feels slightly dizzy but denies any change in vision paresthesias or weakness of lower extremities, no medical history, he denies drug use or alcohol use, never has the past.  No other complaints..  Review of Systems  Positive: Dizziness, headache Negative: Change in vision, paresthesias  Physical Exam  BP (!) 153/87   Pulse 87   Temp 98 F (36.7 C) (Oral)   Resp 15   Ht 5' 5"$  (1.651 m)   Wt 79.4 kg   SpO2 100%   BMI 29.12 kg/m  Gen:   Awake, no distress   Resp:  Normal effort  MSK:   Moves extremities without difficulty  Other:  Cranial nerves II through XII grossly intact no difficulty with word finding following two-step commands there is no unilateral weakness present.  Medical Decision Making  Medically screening exam initiated at 4:30 AM.  Appropriate orders placed.  Brett Sanders was informed that the remainder of the evaluation will be completed by another provider, this initial triage assessment does not replace that evaluation, and the importance of remaining in the ED until their evaluation is complete.  Patient is outside stroke window, I do not know any LVO symptoms, will obtain basic lab or imaging.   Brett Fennel, PA-C 12/19/22 [phone]
